# Patient Record
Sex: Male | Born: 1995 | Race: White | Hispanic: No | Marital: Single | Smoking: Current every day smoker
Health system: Southern US, Community
[De-identification: ages and names within clinical notes are randomized; demographics above are authoritative.]

## PROBLEM LIST (undated history)

## (undated) HISTORY — PX: ANKLE SURGERY: SHX546

---
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
## 2015-08-13 ENCOUNTER — Emergency Department (HOSPITAL_COMMUNITY): Payer: Self-pay

## 2015-08-13 ENCOUNTER — Emergency Department (HOSPITAL_COMMUNITY)
Admission: EM | Admit: 2015-08-13 | Discharge: 2015-08-13 | Disposition: A | Discharge status: Home / Self Care | Payer: Self-pay | Attending: Emergency Medicine | Admitting: Emergency Medicine

## 2015-08-13 ENCOUNTER — Encounter (HOSPITAL_COMMUNITY): Payer: Self-pay | Admitting: Oncology

## 2015-08-13 DIAGNOSIS — Y9289 Other specified places as the place of occurrence of the external cause: Secondary | ICD-10-CM | POA: Insufficient documentation

## 2015-08-13 DIAGNOSIS — S6010XA Contusion of unspecified finger with damage to nail, initial encounter: Secondary | ICD-10-CM

## 2015-08-13 DIAGNOSIS — F17210 Nicotine dependence, cigarettes, uncomplicated: Secondary | ICD-10-CM | POA: Insufficient documentation

## 2015-08-13 DIAGNOSIS — S60131A Contusion of right middle finger with damage to nail, initial encounter: Secondary | ICD-10-CM | POA: Insufficient documentation

## 2015-08-13 DIAGNOSIS — Y990 Civilian activity done for income or pay: Secondary | ICD-10-CM | POA: Insufficient documentation

## 2015-08-13 DIAGNOSIS — Y9389 Activity, other specified: Secondary | ICD-10-CM | POA: Insufficient documentation

## 2015-08-13 DIAGNOSIS — W231XXA Caught, crushed, jammed, or pinched between stationary objects, initial encounter: Secondary | ICD-10-CM | POA: Insufficient documentation

## 2015-08-13 DIAGNOSIS — Z23 Encounter for immunization: Secondary | ICD-10-CM | POA: Insufficient documentation

## 2015-08-13 MED ORDER — TETANUS-DIPHTH-ACELL PERTUSSIS 5-2.5-18.5 LF-MCG/0.5 IM SUSP
0.5000 mL | Freq: Once | INTRAMUSCULAR | Status: AC
  Administered 2015-08-13: 0.5 mL via INTRAMUSCULAR
  Filled 2015-08-13: qty 0.5

## 2015-08-13 MED ORDER — LIDOCAINE HCL 2 % IJ SOLN
10.0000 mL | Freq: Once | INTRAMUSCULAR | Status: DC
  Filled 2015-08-13: qty 20

## 2015-08-13 NOTE — Discharge Instructions (Signed)
Subungual Hematoma A subungual hematoma is a pocket of blood that collects under the fingernail or toenail. The pressure created by the blood under the nail can cause pain. CAUSES  A subungual hematoma occurs when an injury to the finger or toe causes a blood vessel beneath the nail to break. The injury can occur from a direct blow such as slamming a finger in a door. It can also occur from a repeated injury such as pressure on the foot in a shoe while running. A subungual hematoma is sometimes called runner's toe or tennis toe. SYMPTOMS   Blue or dark blue skin under the nail.  Pain or throbbing in the injured area. DIAGNOSIS  Your caregiver can determine whether you have a subungual hematoma based on your history and a physical exam. If your caregiver thinks you might have a broken (fractured) bone, X-rays may be taken. TREATMENT  Hematomas usually go away on their own over time. Your caregiver may make a hole in the nail to drain the blood. Draining the blood is painless and usually provides significant relief from pain and throbbing. The nail usually grows back normally after this procedure. In some cases, the nail may need to be removed. This is done if there is a cut under the nail that requires stitches (sutures). HOME CARE INSTRUCTIONS   Put ice on the injured area.  Put ice in a plastic bag.  Place a towel between your skin and the bag.  Leave the ice on for 15-20 minutes, 03-04 times a day for the first 1 to 2 days.  Elevate the injured area to help decrease pain and swelling.  If you were given a bandage, wear it for as long as directed by your caregiver.  If part of your nail falls off, trim the remaining nail gently. This prevents the nail from catching on something and causing further injury.  Only take over-the-counter or prescription medicines for pain, discomfort, or fever as directed by your caregiver. SEEK IMMEDIATE MEDICAL CARE IF:   You have redness or swelling  around the nail.  You have yellowish-white fluid (pus) coming from the nail.  Your pain is not controlled with medicine.  You have a fever. MAKE SURE YOU:  Understand these instructions.  Will watch your condition.  Will get help right away if you are not doing well or get worse.   This information is not intended to replace advice given to you by your health care provider. Make sure you discuss any questions you have with your health care provider.   Document Released: 06/22/2000 Document Revised: 09/17/2011 Document Reviewed: 11/10/2014 Elsevier Interactive Patient Education 2016 [employer]  

## 2015-08-13 NOTE — ED Notes (Signed)
Pt c/o right middle finger pain d/t smashing it w/ a crowbar while working on his truck.  No obvious deformity noted.  Pt rates pain 4/10.

## 2015-08-13 NOTE — ED Provider Notes (Signed)
CSN: [id number]     Arrival date & time 08/13/15  1918 History  By signing my name below, I, Emmanuella Mensah, attest that this documentation has been prepared under the direction and in the presence of Marlon Pel, PA-C. Electronically Signed: Angelene Giovanni, ED Scribe. 08/13/2015. 9:15 PM.    Chief Complaint  Patient presents with  . Finger Injury   The history is provided by the patient. No language interpreter was used.   HPI Comments: Kyle Bush is a 20 y.o. male who presents to the Emergency Department complaining of gradually worsening moderate right middle finger pain s/p crushing it with a crowbar approx. 2 hours ago while working on his truck. Pt reports with blood under his right middle finger nailbed. No alleviating factors noted. Pt has not taken any medications PTA. Pt is unsure if his tetanus vaccine is UTD. No fever, numbness/tingling, rash, or n//v. Mom sent him in to get blood under nailbed drained   History reviewed. No pertinent past medical history. Past Surgical History  Procedure Laterality Date  . Ankle surgery     No family history on file. Social History  Substance Use Topics  . Smoking status: Current Every Day Smoker -- 1.00 packs/day    Types: Cigarettes  . Smokeless tobacco: Current User  . Alcohol Use: No    Review of Systems  Constitutional: Negative for fever.  Gastrointestinal: Negative for nausea and vomiting.  Musculoskeletal: Positive for arthralgias (right middle finger).  Skin: Negative for rash.  Neurological: Negative for numbness.  All other systems reviewed and are negative.   Allergies  Review of patient's allergies indicates no known allergies.  Home Medications   Prior to Admission medications   Medication Sig Start Date End Date Taking? Authorizing Provider  ibuprofen (ADVIL,MOTRIN) 200 MG tablet Take 600 mg by mouth every 6 (six) hours as needed. For pain   Yes Historical Provider, MD   BP 154/92 mmHg  Pulse 61   Temp(Src) 98.4 F (36.9 C) (Oral)  Resp 16  SpO2 97% Physical Exam  Constitutional: He is oriented to person, place, and time. He appears well-developed and well-nourished. No distress.  HENT:  Head: Normocephalic and atraumatic.  Eyes: Conjunctivae and EOM are normal.  Neck: Neck supple. No tracheal deviation present.  Cardiovascular: Normal rate.   Pulmonary/Chest: Effort normal. No respiratory distress.  Musculoskeletal: Normal range of motion.  Distal finger sensations intact, CR < 2 seconds. FROM. No disruption to nailbed or laceration. Small subungual hematoma  Neurological: He is alert and oriented to person, place, and time.  Skin: Skin is warm and dry.  Psychiatric: He has a normal mood and affect. His behavior is normal.  Nursing note and vitals reviewed.   ED Course  Procedures (including critical care time) DIAGNOSTIC STUDIES: Oxygen Saturation is 97% on RA, normal by my interpretation.    COORDINATION OF CARE: 8:40 PM- Pt advised of plan for treatment and pt agrees. Pt informed of x-ray results. He will receive lidocaine for a trephination. Pt will receive a topical antibiotic and a finger splint.   Trephination: Small amount of blood drained from the hematoma, finger was cleaned, finger splint applied and pt updated on tetanus. Discussed wound care to keep area clean and dry and to apply antibiotic ointment twice a day. Also discussed return precaution of finger becoming hot or pain becoming worse, any redness or decreased ROM.. Follow up with Hand Specialist as needed.   NERVE BLOCK Performed by: Dorthula Matas Consent: Verbal consent  obtained. Required items: required blood products, implants, devices, and special equipment available Time out: Immediately prior to procedure a "time out" was called to verify the correct patient, procedure, equipment, support staff and site/side marked as required.  Indication: Trephination Nerve block body site: middle finger on  right hand  Preparation: Patient was prepped and draped in the usual sterile fashion. Needle gauge: 24 G Location technique: anatomical landmarks  Local anesthetic: lidocaine 1 % wo  Anesthetic total: 1 ml  Outcome: pain improved Patient tolerance: Patient tolerated the procedure well with no immediate complications.  Imaging Review Dg Hand Complete Right  08/13/2015  CLINICAL DATA:  Distal right middle finger pain with swelling and bruising. Pt smashed his right middle finger between his "truck and a pry bar" today. EXAM: RIGHT HAND - COMPLETE 3+ VIEW COMPARISON:  None. FINDINGS: There is no evidence of fracture or dislocation. There is no evidence of arthropathy or other focal bone abnormality. Soft tissues are unremarkable. IMPRESSION: Negative. Electronically Signed   By: Norva Pavlov M.D.   On: 08/13/2015 20:18     Marlon Pel, PA-C has personally reviewed and evaluated these images as part of her medical decision-making.   MDM   Final diagnoses:  Subungual hematoma of digit of hand, initial encounter    I personally performed the services described in this documentation, which was scribed in my presence. The recorded information has been reviewed and is accurate.   Marlon Pel, PA-C 08/13/15 2118  Margarita Grizzle, MD 08/17/15 [phone]

## 2016-04-27 ENCOUNTER — Ambulatory Visit (INDEPENDENT_AMBULATORY_CARE_PROVIDER_SITE_OTHER): Payer: BLUE CROSS/BLUE SHIELD | Admitting: Physician Assistant

## 2016-04-27 ENCOUNTER — Ambulatory Visit (INDEPENDENT_AMBULATORY_CARE_PROVIDER_SITE_OTHER): Payer: BLUE CROSS/BLUE SHIELD

## 2016-04-27 VITALS — BP 130/74 | HR 82 | Temp 98.1°F | Resp 17 | Ht 74.75 in | Wt 233.0 lb

## 2016-04-27 DIAGNOSIS — J3489 Other specified disorders of nose and nasal sinuses: Secondary | ICD-10-CM | POA: Diagnosis not present

## 2016-04-27 DIAGNOSIS — R52 Pain, unspecified: Secondary | ICD-10-CM | POA: Diagnosis not present

## 2016-04-27 DIAGNOSIS — R6883 Chills (without fever): Secondary | ICD-10-CM | POA: Diagnosis not present

## 2016-04-27 DIAGNOSIS — R0989 Other specified symptoms and signs involving the circulatory and respiratory systems: Secondary | ICD-10-CM | POA: Diagnosis not present

## 2016-04-27 LAB — POCT INFLUENZA A/B
INFLUENZA A, POC: NEGATIVE
INFLUENZA B, POC: NEGATIVE

## 2016-04-27 NOTE — Patient Instructions (Addendum)
Your chest xray was normal.  Your flu swab was negative.  Your exam was reassuring today, you likely have a viral upper respiratory infection causing your symptoms.  If you don't feel better by early next week or you start to feel worse please give us a call.

## 2016-04-27 NOTE — Progress Notes (Signed)
Patient ID: Kyle Bush, male    DOB: Apr 22, 1996, 20 y.o.   MRN: [id number]  PCP: No primary care provider on file.  Chief Complaint  Patient presents with  . Generalized Body Aches    Onset 1 week  . Chills  . Nasal Congestion    Subjective:   HPI:  20 yo healthy male presents with above complaints.   Sx started 6 days ago with rhinorrhea and head congestion. Persistent. Three days started having chills. Has not checked temp at home but has felt warm. Generalized body aches past 2 days. Persistent. Cough past 3-4 days, at time productive with yellow sputum. Denies sore throat or otalgia. Denies outdoors exposure or tick exposure. No rash. No recent travel. No known sick contacts. Has not received flu vaccine this year.   Denies abd pain, n/v, diarrhea. Denies neck stiffness.   There are no active problems to display for this patient.   History reviewed. No pertinent past medical history.   Prior to Admission medications   Not on File    No Known Allergies  Past Surgical History:  Procedure Laterality Date  . ANKLE SURGERY      Family History  Problem Relation Age of Onset  . Diabetes Maternal Grandmother   . Heart disease Maternal Grandmother   . Cancer Maternal Grandfather     Social History   Social History  . Marital status: Single    Spouse name: N/A  . Number of children: N/A  . Years of education: N/A   Social History Main Topics  . Smoking status: Former Smoker    Packs/day: 1.00    Types: Cigarettes    Quit date: 04/23/2016  . Smokeless tobacco: Current User  . Alcohol use No  . Drug use: No  . Sexual activity: Yes    Birth control/ protection: Condom   Other Topics Concern  . None   Social History Narrative  . None    Review of Systems  See HPI     Objective:  Physical Exam  Constitutional: He appears well-developed and well-nourished.  Non-toxic appearance. He does not have a sickly appearance. He appears ill. No distress.    HENT:  Right Ear: Tympanic membrane normal.  Left Ear: Tympanic membrane normal.  Nose: Mucosal edema and rhinorrhea present. Right sinus exhibits no maxillary sinus tenderness and no frontal sinus tenderness. Left sinus exhibits no maxillary sinus tenderness and no frontal sinus tenderness.  Mouth/Throat: Uvula is midline, oropharynx is clear and moist and mucous membranes are normal.  Neck: No Brudzinski's sign noted.  Pulmonary/Chest: Effort normal. No tachypnea. He has no decreased breath sounds. He has wheezes in the right upper field. He has rhonchi in the right upper field. He has no rales.  Abdominal: Soft. Normal appearance and bowel sounds are normal.  Skin: No rash noted.  BP 130/74 (BP Location: Right Arm, Patient Position: Sitting, Cuff Size: Large)   Pulse 82   Temp 98.1 F (36.7 C) (Oral)   Resp 17   Ht 6' 2.75" (1.899 m)   Wt 233 lb (105.7 kg)   SpO2 96%   BMI 29.32 kg/m   10/20 CXR  FINDINGS: Central pulmonary vessels are slightly prominent for size. Heart size is within normal limits. Few densities along the posterior lower chest on the lateral view could represent atelectasis. Otherwise, no focal airspace disease or pulmonary edema. No large pleural effusions. No acute bone abnormality.  IMPRESSION: Question basilar atelectasis.  Results for orders placed  or performed in visit on 04/27/16  POCT Influenza A/B  Result Value Ref Range   Influenza A, POC Negative Negative   Influenza B, POC Negative Negative      Assessment & Plan:   Body aches - Plan: POCT Influenza A/B  Chills - Plan: POCT Influenza A/B  Rhinorrhea  Abnormal lung sounds - Plan: DG Chest 2 View -- CXR normal, flu swab negative -- No rash, exposures to suggest tick borne illness -- VS normal today -- suspect viral URI, hopeful course is winding down -- If symptoms persist 3-4 more days could consider antibiotics for sinus infection, though no purulent nasal drainage, sinus ttp, or  fevers today to suggest antibiotic need today  Donnajean Lopesodd M. Jilleen Essner, PA-C Physician Assistant-Certified [facility]  04/27/2016 9:16 AM

## 2016-07-02 ENCOUNTER — Emergency Department (HOSPITAL_COMMUNITY)
Admission: EM | Admit: 2016-07-02 | Discharge: 2016-07-02 | Disposition: A | Discharge status: Home / Self Care | Payer: BLUE CROSS/BLUE SHIELD | Attending: Emergency Medicine | Admitting: Emergency Medicine

## 2016-07-02 ENCOUNTER — Encounter (HOSPITAL_COMMUNITY): Payer: Self-pay | Admitting: Emergency Medicine

## 2016-07-02 DIAGNOSIS — R509 Fever, unspecified: Secondary | ICD-10-CM | POA: Insufficient documentation

## 2016-07-02 DIAGNOSIS — J111 Influenza due to unidentified influenza virus with other respiratory manifestations: Secondary | ICD-10-CM

## 2016-07-02 DIAGNOSIS — Z87891 Personal history of nicotine dependence: Secondary | ICD-10-CM | POA: Diagnosis not present

## 2016-07-02 DIAGNOSIS — R05 Cough: Secondary | ICD-10-CM | POA: Insufficient documentation

## 2016-07-02 DIAGNOSIS — R69 Illness, unspecified: Secondary | ICD-10-CM

## 2016-07-02 MED ORDER — ONDANSETRON 4 MG PO TBDP: 4.0000 mg | ORAL_TABLET | Freq: Three times a day (TID) | ORAL | 0 refills | Status: DC | PRN

## 2016-07-02 MED ORDER — BENZONATATE 100 MG PO CAPS: 100.0000 mg | ORAL_CAPSULE | Freq: Three times a day (TID) | ORAL | 0 refills | Status: DC

## 2016-07-02 MED ORDER — IBUPROFEN 800 MG PO TABS: 800.0000 mg | ORAL_TABLET | Freq: Three times a day (TID) | ORAL | 0 refills | Status: AC

## 2016-07-02 MED ORDER — ALBUTEROL SULFATE HFA 108 (90 BASE) MCG/ACT IN AERS: 2.0000 | INHALATION_SPRAY | Freq: Four times a day (QID) | RESPIRATORY_TRACT | 0 refills | Status: DC | PRN

## 2016-07-02 NOTE — ED Triage Notes (Signed)
Pt reports generalized body aches, cough, nasal congestion, and HA for the past 4 days. Initially had sore throat, but that has resolved.

## 2016-07-02 NOTE — Discharge Instructions (Signed)
Your symptoms are consistent with a viral illness. Viruses do not require antibiotics. Treatment is symptomatic care and it is important to note that these symptoms may last for 7-10 days. Drink plenty of fluids and get plenty of rest. You should be drinking at least a half liter of water an hour to stay hydrated. Ibuprofen, Naproxen, or Tylenol for pain or fever. Zofran for nausea. Tessalon for cough. Plain Mucinex may help relieve congestion. Warm liquids or Chloraseptic spray may help soothe a sore throat. Follow up with a primary care provider, as needed, for any future management of this issue.

## 2016-07-02 NOTE — ED Provider Notes (Signed)
[facility]-EMERGENCY DEPT Provider Note   CSN: [id number] Arrival date & time: 07/02/16  1444     History   Chief Complaint Chief Complaint  Patient presents with  . Generalized Body Aches  . Cough    HPI Kyle Bush is a 20 y.o. male.  HPI   Kyle GreenlandBenjamin Flud is a 20 y.o. male, Patient no pertinent past medical history, presenting to the ED with Nonproductive cough, fever, body aches, congestion, nausea, and headache for the last 4 days. Patient has taken NyQuil and ibuprofen with minimal relief. Patient admits to poor hydration. Denies shortness of breath, chest pain, abdominal pain, vomiting/diarrhea, or any other complaints.     History reviewed. No pertinent past medical history.  There are no active problems to display for this patient.   Past Surgical History:  Procedure Laterality Date  . ANKLE SURGERY         Home Medications    Prior to Admission medications   Medication Sig Start Date End Date Taking? Authorizing Provider  albuterol (PROVENTIL HFA;VENTOLIN HFA) 108 (90 Base) MCG/ACT inhaler Inhale 2 puffs into the lungs every 6 (six) hours as needed for wheezing or shortness of breath. 07/02/16   Shawn C Joy, PA-C  benzonatate (TESSALON) 100 MG capsule Take 1 capsule (100 mg total) by mouth every 8 (eight) hours. 07/02/16   Shawn C Joy, PA-C  ibuprofen (ADVIL,MOTRIN) 800 MG tablet Take 1 tablet (800 mg total) by mouth 3 (three) times daily. 07/02/16   Shawn C Joy, PA-C  ondansetron (ZOFRAN ODT) 4 MG disintegrating tablet Take 1 tablet (4 mg total) by mouth every 8 (eight) hours as needed for nausea or vomiting. 07/02/16   Anselm PancoastShawn C Joy, PA-C    Family History Family History  Problem Relation Age of Onset  . Diabetes Maternal Grandmother   . Heart disease Maternal Grandmother   . Cancer Maternal Grandfather     Social History Social History  Substance Use Topics  . Smoking status: Former Smoker    Packs/day: 1.00    Types: Cigarettes    Quit date:  04/23/2016  . Smokeless tobacco: Current User  . Alcohol use No     Allergies   Patient has no known allergies.   Review of Systems Review of Systems  Constitutional: Positive for fever. Negative for diaphoresis.  Respiratory: Positive for cough. Negative for shortness of breath.   Cardiovascular: Negative for chest pain.  Gastrointestinal: Positive for nausea. Negative for abdominal pain, diarrhea and vomiting.  Musculoskeletal: Positive for myalgias.  Skin: Negative for rash.  All other systems reviewed and are negative.    Physical Exam Updated Vital Signs BP 151/82   Pulse 90   Temp 99.9 F (37.7 C) (Oral)   Resp 18   SpO2 95%   Physical Exam  Constitutional: He appears well-developed and well-nourished. No distress.  HENT:  Head: Normocephalic and atraumatic.  Eyes: Conjunctivae are normal.  Neck: Normal range of motion. Neck supple.  Cardiovascular: Normal rate, regular rhythm, normal heart sounds and intact distal pulses.   Pulmonary/Chest: Effort normal. No respiratory distress. He has wheezes.  No increased work of breathing. Patient speaks in full sentences.  Abdominal: Soft. There is no tenderness. There is no guarding.  Musculoskeletal: He exhibits no edema.  Lymphadenopathy:    He has no cervical adenopathy.  Neurological: He is alert.  Skin: Skin is warm and dry. He is not diaphoretic.  Psychiatric: He has a normal mood and affect. His behavior is normal.  Nursing note and vitals reviewed.    ED Treatments / Results  Labs (all labs ordered are listed, but only abnormal results are displayed) Labs Reviewed - No data to display  EKG  EKG Interpretation None       Radiology No results found.  Procedures Procedures (including critical care time)  Medications Ordered in ED Medications - No data to display   Initial Impression / Assessment and Plan / ED Course  I have reviewed the triage vital signs and the nursing notes.  Pertinent  labs & imaging results that were available during my care of the patient were reviewed by me and considered in my medical decision making (see chart for details).  Clinical Course     Patient was symptoms of influenza-like illness versus URI. Nontoxic appearing. Symptomatic care and return precautions discussed. Patient voiced understanding of all instructions and is comfortable with discharge.    Final Clinical Impressions(s) / ED Diagnoses   Final diagnoses:  Influenza-like illness    New Prescriptions New Prescriptions   ALBUTEROL (PROVENTIL HFA;VENTOLIN HFA) 108 (90 BASE) MCG/ACT INHALER    Inhale 2 puffs into the lungs every 6 (six) hours as needed for wheezing or shortness of breath.   BENZONATATE (TESSALON) 100 MG CAPSULE    Take 1 capsule (100 mg total) by mouth every 8 (eight) hours.   IBUPROFEN (ADVIL,MOTRIN) 800 MG TABLET    Take 1 tablet (800 mg total) by mouth 3 (three) times daily.   ONDANSETRON (ZOFRAN ODT) 4 MG DISINTEGRATING TABLET    Take 1 tablet (4 mg total) by mouth every 8 (eight) hours as needed for nausea or vomiting.     Anselm PancoastShawn C Joy, PA-C 07/02/16 1518    Lorre NickAnthony Allen, MD 07/04/16 [phone]

## 2017-02-28 ENCOUNTER — Encounter (HOSPITAL_COMMUNITY): Payer: Self-pay | Admitting: Emergency Medicine

## 2017-02-28 ENCOUNTER — Emergency Department (HOSPITAL_COMMUNITY)
Admission: EM | Admit: 2017-02-28 | Discharge: 2017-02-28 | Disposition: A | Discharge status: Home / Self Care | Payer: Self-pay | Attending: Emergency Medicine | Admitting: Emergency Medicine

## 2017-02-28 DIAGNOSIS — Z5321 Procedure and treatment not carried out due to patient leaving prior to being seen by health care provider: Secondary | ICD-10-CM | POA: Insufficient documentation

## 2017-02-28 DIAGNOSIS — R51 Headache: Secondary | ICD-10-CM | POA: Insufficient documentation

## 2017-02-28 NOTE — ED Notes (Signed)
Pt refused recheck v/s. Pt states he has been waiting to long and is leaving

## 2017-02-28 NOTE — ED Triage Notes (Signed)
Pt states he has had headaches, seeing spots and an episode of L hand numbness x 1 week. Alert and oriented.

## 2018-07-25 IMAGING — DX DG CHEST 2V
2 series · 2 of 2 positions shown · non-contrast
Comparison: None.

CLINICAL DATA: Shortness of breath with wheezing, chills and cough.
Abnormal lung sounds.

EXAM:
CHEST  2 VIEW

[chest pa]
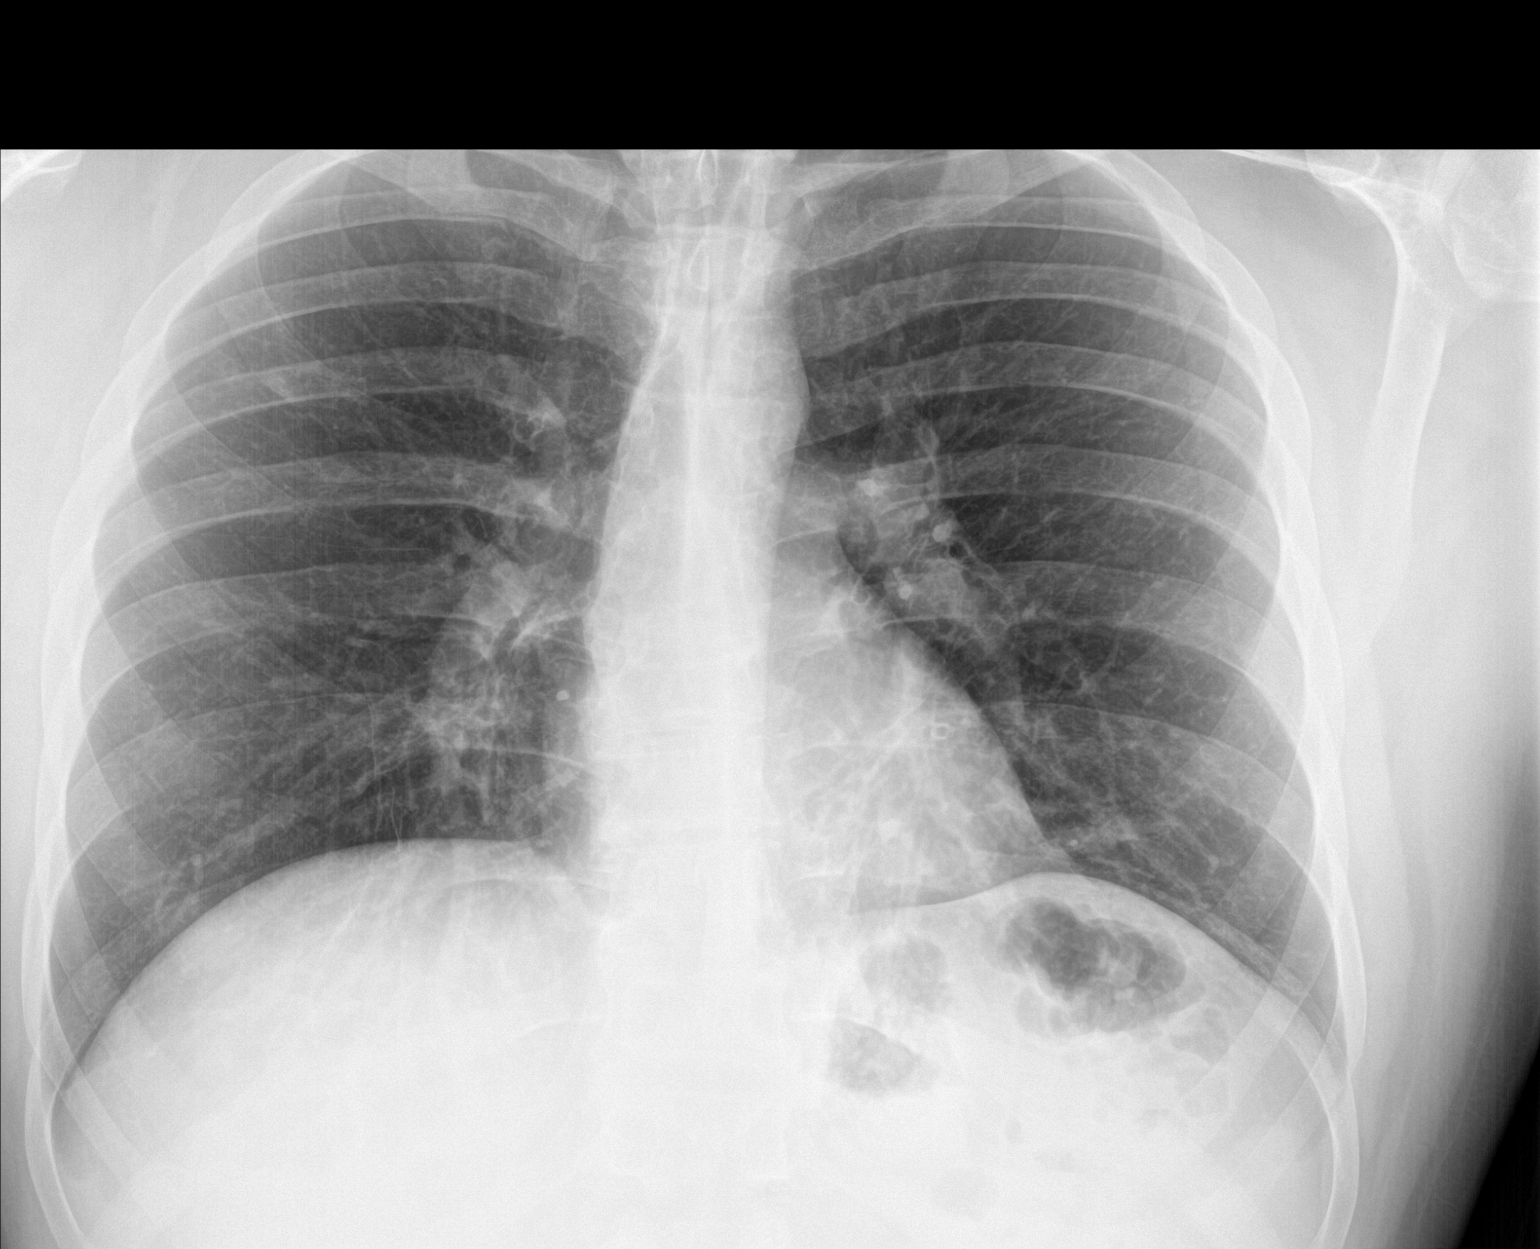

[chest lat]
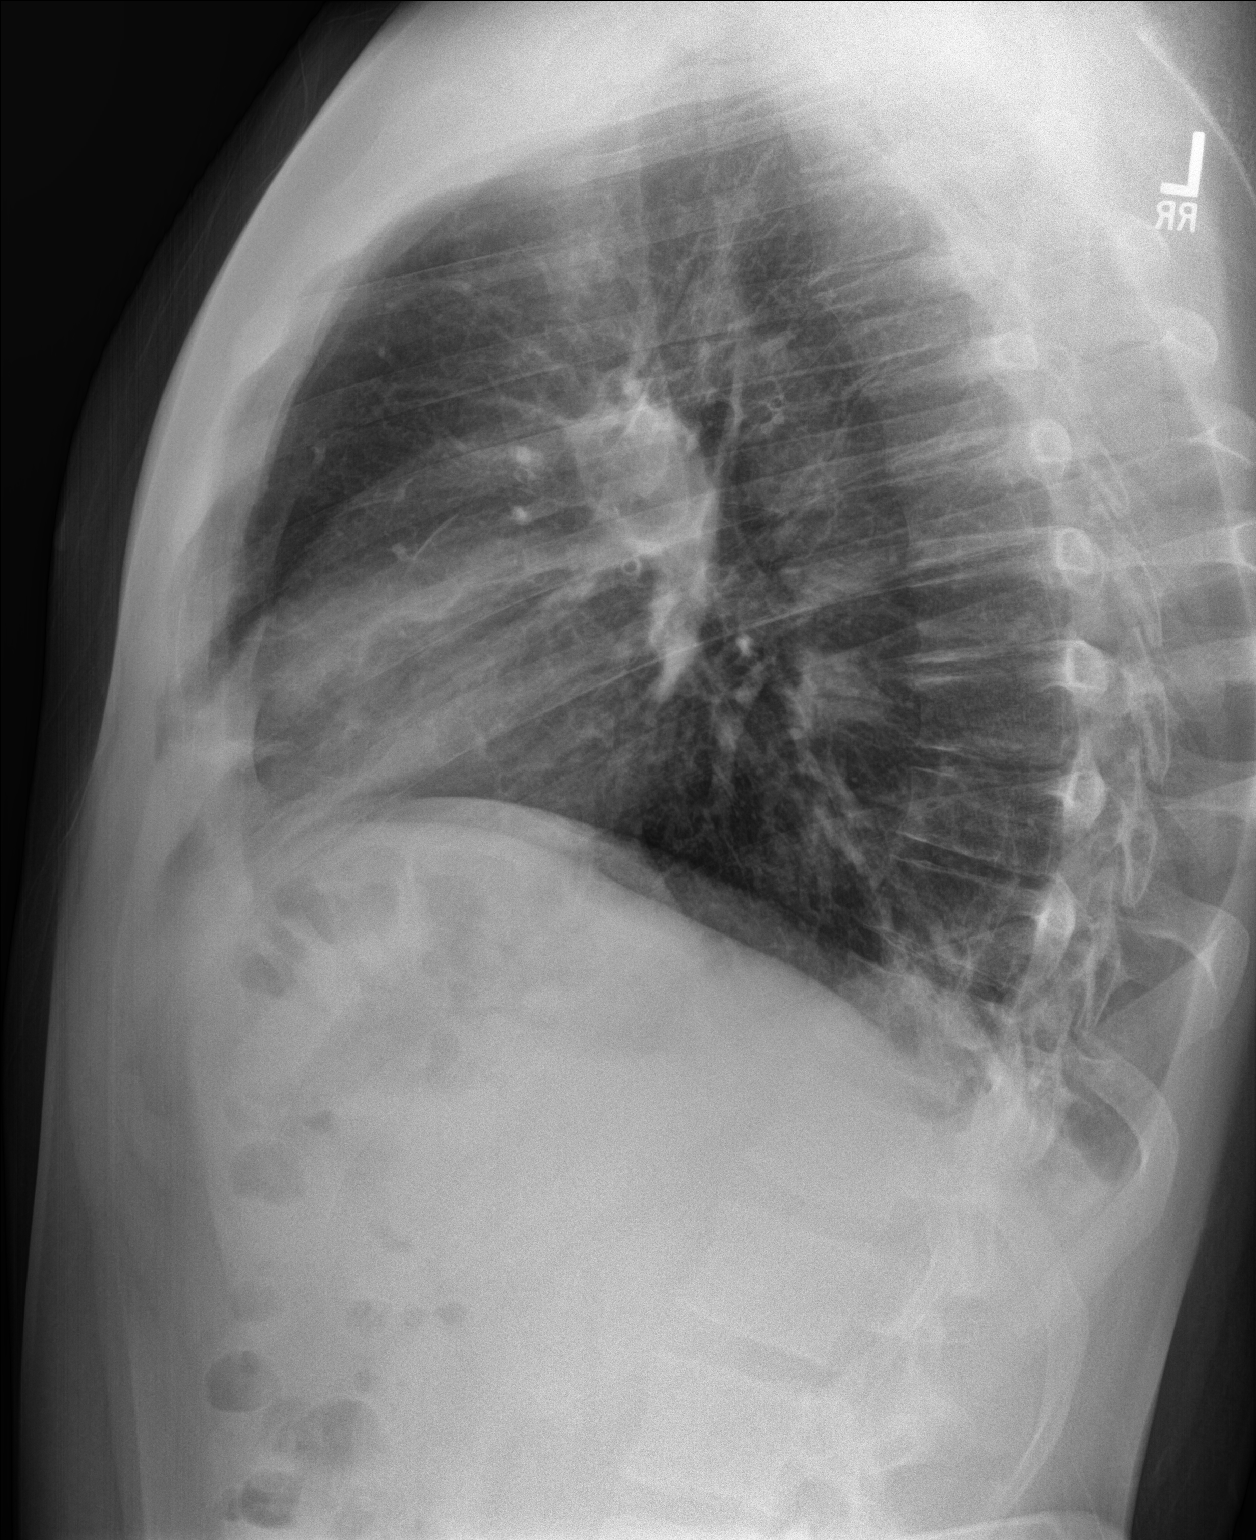

[2 of 2 positions shown; findings below may reference images not displayed]

FINDINGS: Central pulmonary vessels are slightly prominent for size. Heart
size is within normal limits. Few densities along the posterior
lower chest on the lateral view could represent atelectasis.
Otherwise, no focal airspace disease or pulmonary edema. No large
pleural effusions. No acute bone abnormality.
IMPRESSION: Question basilar atelectasis.

## 2019-06-22 ENCOUNTER — Other Ambulatory Visit: Payer: Self-pay

## 2019-06-22 ENCOUNTER — Ambulatory Visit
Admission: EM | Admit: 2019-06-22 | Discharge: 2019-06-22 | Disposition: A | Discharge status: Home / Self Care | Payer: Self-pay | Attending: Emergency Medicine | Admitting: Emergency Medicine

## 2019-06-22 ENCOUNTER — Encounter: Payer: Self-pay | Admitting: Emergency Medicine

## 2019-06-22 DIAGNOSIS — B359 Dermatophytosis, unspecified: Secondary | ICD-10-CM

## 2019-06-22 MED ORDER — KETOCONAZOLE 2 % EX CREA: 1.0000 "application " | TOPICAL_CREAM | Freq: Every day | CUTANEOUS | 0 refills | Status: AC

## 2019-06-22 MED ORDER — HYDROCORTISONE 1 % EX CREA: 1.0000 "application " | TOPICAL_CREAM | Freq: Two times a day (BID) | CUTANEOUS | 0 refills | Status: AC

## 2019-06-22 NOTE — Discharge Instructions (Addendum)
Next equal parts including fungal and steroid cream and clean hands prior to application. Apply to spots only: Do this twice daily for the next week. May take Benadryl at night as needed for any itching. Return for persistent, worsening symptoms, worsening pain, fever.

## 2019-06-22 NOTE — ED Provider Notes (Signed)
[facility]    CSN: [id number] Arrival date & time: 06/22/19  1435      History   Chief Complaint Chief Complaint  Patient presents with  . Rash    HPI Kyle Bush is a 23 y.o. male presenting for 1 week course of circumferential, pruritic facial rash.  Patient states that he works in a [employer], also cut down bamboo recently.  Has not tried anything for his symptoms.  Seek evaluation today as it has now spread to his right lower leg near his boot area.  Patient denies biting date, trauma, arthralgias, myalgias, chest pain, fever.  No recent travel.  Patient admits to tobacco use: Denies alcohol use.  Rarely smokes marijuana.  No illicit drug use.  Never had this before.   History reviewed. No pertinent past medical history.  There are no problems to display for this patient.   Past Surgical History:  Procedure Laterality Date  . ANKLE SURGERY         Home Medications    Prior to Admission medications   Medication Sig Start Date End Date Taking? Authorizing Provider  hydrocortisone cream 1 % Apply 1 application topically 2 (two) times daily. 06/22/19   Hall-Potvin, [place], PA-C  ibuprofen (ADVIL,MOTRIN) 800 MG tablet Take 1 tablet (800 mg total) by mouth 3 (three) times daily. 07/02/16   Joy, Shawn C, PA-C  ketoconazole (NIZORAL) 2 % cream Apply 1 application topically daily. 06/22/19   Hall-Potvin, [place], PA-C  albuterol (PROVENTIL HFA;VENTOLIN HFA) 108 (90 Base) MCG/ACT inhaler Inhale 2 puffs into the lungs every 6 (six) hours as needed for wheezing or shortness of breath. 07/02/16 06/22/19  Lorayne Bender, PA-C    Family History Family History  Problem Relation Age of Onset  . Diabetes Maternal Grandmother   . Heart disease Maternal Grandmother   . Cancer Maternal Grandfather     Social History Social History   Tobacco Use  . Smoking status: Current Every Day Smoker    Packs/day: 1.00    Types: Cigarettes    Last attempt to quit:  04/23/2016    Years since quitting: 3.1  . Smokeless tobacco: Current User  Substance Use Topics  . Alcohol use: No  . Drug use: No     Allergies   Patient has no known allergies.   Review of Systems Review of Systems  Constitutional: Negative for chills, fatigue and fever.  Respiratory: Negative for cough and shortness of breath.   Cardiovascular: Negative for chest pain and palpitations.  Gastrointestinal: Negative for abdominal pain, diarrhea and vomiting.  Musculoskeletal: Negative for arthralgias, back pain, gait problem, joint swelling, myalgias, neck pain and neck stiffness.  Skin: Positive for rash. Negative for wound.  Neurological: Negative for speech difficulty and headaches.  All other systems reviewed and are negative.    Physical Exam Triage Vital Signs ED Triage Vitals  Enc Vitals Group     BP 06/22/19 1444 125/77     Pulse Rate 06/22/19 1444 87     Resp 06/22/19 1444 18     Temp 06/22/19 1444 97.8 F (36.6 C)     Temp Source 06/22/19 1444 Temporal     SpO2 06/22/19 1444 98 %     Weight --      Height --      Head Circumference --      Peak Flow --      Pain Score 06/22/19 1445 0     Pain Loc --  Pain Edu? --      Excl. in GC? --    No data found.  Updated Vital Signs BP 125/77 (BP Location: Left Arm)   Pulse 87   Temp 97.8 F (36.6 C) (Temporal)   Resp 18   SpO2 98%   Visual Acuity Right Eye Distance:   Left Eye Distance:   Bilateral Distance:    Right Eye Near:   Left Eye Near:    Bilateral Near:     Physical Exam Constitutional:      General: He is not in acute distress.    Appearance: He is normal weight. He is not ill-appearing.  HENT:     Head: Normocephalic and atraumatic.     Mouth/Throat:     Mouth: Mucous membranes are moist.     Pharynx: Oropharynx is clear.  Eyes:     General: No scleral icterus.    Pupils: Pupils are equal, round, and reactive to light.  Cardiovascular:     Rate and Rhythm: Normal rate and  regular rhythm.     Heart sounds: No murmur. No gallop.   Pulmonary:     Effort: Pulmonary effort is normal. No respiratory distress.     Breath sounds: No wheezing.  Musculoskeletal:     Cervical back: No tenderness.  Lymphadenopathy:     Cervical: No cervical adenopathy.  Skin:    Capillary Refill: Capillary refill takes less than 2 seconds.     Coloration: Skin is not jaundiced or pale.     Findings: Rash present.     Comments: Patient with circumferential centrally cleared erythematous lesions with excoriations over left cheek.  No surrounding erythema, warmth.  No active discharge, bleeding.  Patient is single circumferential lesion over right lower extremity, medial aspect that is similar to lesion on left cheek.  Neurological:     General: No focal deficit present.     Mental Status: He is alert and oriented to person, place, and time.      UC Treatments / Results  Labs (all labs ordered are listed, but only abnormal results are displayed) Labs Reviewed - No data to display  EKG  Radiology No results found.  Procedures Procedures (including critical care time)  Medications Ordered in UC Medications - No data to display  Initial Impression / Assessment and Plan / UC Course  I have reviewed the triage vital signs and the nursing notes.  Pertinent labs & imaging results that were available during my care of the patient were reviewed by me and considered in my medical decision making (see chart for details).     Patient afebrile, nontoxic, without systemic symptoms.  H&P concerning for ringworm versus contact dermatitis: We will trial antifungal in conjunction with low-dose steroid.  Return precautions discussed, patient verbalized understanding and is agreeable to plan. Final Clinical Impressions(s) / UC Diagnoses   Final diagnoses:  Ringworm     Discharge Instructions     Next equal parts including fungal and steroid cream and clean hands prior to  application. Apply to spots only: Do this twice daily for the next week. May take Benadryl at night as needed for any itching. Return for persistent, worsening symptoms, worsening pain, fever.    ED Prescriptions    Medication Sig Dispense Auth. Provider   ketoconazole (NIZORAL) 2 % cream Apply 1 application topically daily. 15 g Hall-Potvin, [place], PA-C   hydrocortisone cream 1 % Apply 1 application topically 2 (two) times daily. 30 g Hall-Potvin, [place], PA-C  PDMP not reviewed this encounter.   Hall-Potvin, [place], [place] JerseyPA-C 06/22/19 1527

## 2019-06-22 NOTE — ED Triage Notes (Signed)
Pt presents to [facility] for assessment of facial rash x 1 week, worsening and spreading.  Patient states he now notes it on his right lower leg under his boot area.

## 2019-06-22 NOTE — ED Notes (Signed)
Patient able to ambulate independently  

## 2022-05-15 ENCOUNTER — Ambulatory Visit (INDEPENDENT_AMBULATORY_CARE_PROVIDER_SITE_OTHER): Payer: BC Managed Care – PPO

## 2022-05-15 ENCOUNTER — Ambulatory Visit
Admission: RE | Admit: 2022-05-15 | Discharge: 2022-05-15 | Disposition: A | Discharge status: Home / Self Care | Payer: Self-pay | Source: Ambulatory Visit | Attending: Physician Assistant | Admitting: Physician Assistant

## 2022-05-15 VITALS — BP 141/88 | HR 68 | Temp 98.0°F | Resp 18

## 2022-05-15 DIAGNOSIS — M545 Low back pain, unspecified: Secondary | ICD-10-CM | POA: Diagnosis not present

## 2022-05-15 DIAGNOSIS — M546 Pain in thoracic spine: Secondary | ICD-10-CM

## 2022-05-15 MED ORDER — PREDNISONE 20 MG PO TABS: 40.0000 mg | ORAL_TABLET | Freq: Every day | ORAL | 0 refills | Status: AC

## 2022-05-15 MED ORDER — CYCLOBENZAPRINE HCL 10 MG PO TABS: 10.0000 mg | ORAL_TABLET | Freq: Two times a day (BID) | ORAL | 0 refills | Status: AC | PRN
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
